# Patient Record
Sex: Female | Born: 1955
Health system: Southern US, Community
[De-identification: ages and names within clinical notes are randomized; demographics above are authoritative.]

## PROBLEM LIST (undated history)

## (undated) DIAGNOSIS — E039 Hypothyroidism, unspecified: Secondary | ICD-10-CM

## (undated) HISTORY — PX: GASTRIC BYPASS: SHX52

## (undated) HISTORY — PX: CARPAL TUNNEL RELEASE: SHX101

---

## 2001-03-08 ENCOUNTER — Other Ambulatory Visit: Admission: RE | Admit: 2001-03-08 | Discharge: 2001-03-08 | Payer: Self-pay | Admitting: Obstetrics and Gynecology

## 2003-03-21 ENCOUNTER — Emergency Department (HOSPITAL_COMMUNITY): Admission: EM | Admit: 2003-03-21 | Discharge: 2003-03-21 | Payer: Self-pay | Admitting: Emergency Medicine

## 2008-05-02 ENCOUNTER — Ambulatory Visit: Payer: Self-pay | Admitting: Emergency Medicine

## 2008-05-02 DIAGNOSIS — J984 Other disorders of lung: Secondary | ICD-10-CM | POA: Insufficient documentation

## 2008-05-02 DIAGNOSIS — E039 Hypothyroidism, unspecified: Secondary | ICD-10-CM | POA: Insufficient documentation

## 2008-05-17 ENCOUNTER — Telehealth: Payer: Self-pay | Admitting: Emergency Medicine

## 2008-05-29 ENCOUNTER — Telehealth (INDEPENDENT_AMBULATORY_CARE_PROVIDER_SITE_OTHER): Payer: Self-pay | Admitting: *Deleted

## 2013-06-07 ENCOUNTER — Other Ambulatory Visit: Payer: Self-pay | Admitting: Radiology

## 2014-08-08 ENCOUNTER — Ambulatory Visit (INDEPENDENT_AMBULATORY_CARE_PROVIDER_SITE_OTHER): Payer: Commercial Managed Care - HMO | Admitting: Surgery

## 2014-09-05 ENCOUNTER — Ambulatory Visit (INDEPENDENT_AMBULATORY_CARE_PROVIDER_SITE_OTHER): Payer: Commercial Managed Care - HMO | Admitting: Surgery

## 2014-09-11 ENCOUNTER — Other Ambulatory Visit (INDEPENDENT_AMBULATORY_CARE_PROVIDER_SITE_OTHER): Payer: Self-pay

## 2014-09-11 ENCOUNTER — Ambulatory Visit (INDEPENDENT_AMBULATORY_CARE_PROVIDER_SITE_OTHER): Payer: Commercial Managed Care - HMO | Admitting: Surgery

## 2014-09-11 DIAGNOSIS — Z9884 Bariatric surgery status: Secondary | ICD-10-CM

## 2014-10-22 ENCOUNTER — Ambulatory Visit
Admission: RE | Admit: 2014-10-22 | Discharge: 2014-10-22 | Disposition: A | Discharge status: Home / Self Care | Payer: Commercial Managed Care - HMO | Source: Ambulatory Visit | Attending: Surgery | Admitting: Surgery

## 2014-10-22 DIAGNOSIS — Z9884 Bariatric surgery status: Secondary | ICD-10-CM

## 2014-12-30 DIAGNOSIS — F321 Major depressive disorder, single episode, moderate: Secondary | ICD-10-CM | POA: Diagnosis not present

## 2015-01-09 DIAGNOSIS — M793 Panniculitis, unspecified: Secondary | ICD-10-CM | POA: Diagnosis not present

## 2015-01-20 DIAGNOSIS — F321 Major depressive disorder, single episode, moderate: Secondary | ICD-10-CM | POA: Diagnosis not present

## 2015-02-06 DIAGNOSIS — F321 Major depressive disorder, single episode, moderate: Secondary | ICD-10-CM | POA: Diagnosis not present

## 2015-02-19 DIAGNOSIS — F321 Major depressive disorder, single episode, moderate: Secondary | ICD-10-CM | POA: Diagnosis not present

## 2015-03-04 DIAGNOSIS — F321 Major depressive disorder, single episode, moderate: Secondary | ICD-10-CM | POA: Diagnosis not present

## 2015-03-17 ENCOUNTER — Other Ambulatory Visit (INDEPENDENT_AMBULATORY_CARE_PROVIDER_SITE_OTHER): Payer: Self-pay

## 2015-03-25 DIAGNOSIS — F321 Major depressive disorder, single episode, moderate: Secondary | ICD-10-CM | POA: Diagnosis not present

## 2015-03-27 DIAGNOSIS — M793 Panniculitis, unspecified: Secondary | ICD-10-CM | POA: Diagnosis not present

## 2015-04-29 DIAGNOSIS — F321 Major depressive disorder, single episode, moderate: Secondary | ICD-10-CM | POA: Diagnosis not present

## 2015-05-20 DIAGNOSIS — F321 Major depressive disorder, single episode, moderate: Secondary | ICD-10-CM | POA: Diagnosis not present

## 2015-05-29 DIAGNOSIS — F418 Other specified anxiety disorders: Secondary | ICD-10-CM | POA: Diagnosis not present

## 2015-05-29 DIAGNOSIS — E039 Hypothyroidism, unspecified: Secondary | ICD-10-CM | POA: Diagnosis not present

## 2015-06-10 DIAGNOSIS — F321 Major depressive disorder, single episode, moderate: Secondary | ICD-10-CM | POA: Diagnosis not present

## 2015-06-23 ENCOUNTER — Other Ambulatory Visit: Payer: Self-pay

## 2015-06-23 DIAGNOSIS — Z01419 Encounter for gynecological examination (general) (routine) without abnormal findings: Secondary | ICD-10-CM | POA: Diagnosis not present

## 2015-06-23 DIAGNOSIS — Z124 Encounter for screening for malignant neoplasm of cervix: Secondary | ICD-10-CM | POA: Diagnosis not present

## 2015-06-24 DIAGNOSIS — F321 Major depressive disorder, single episode, moderate: Secondary | ICD-10-CM | POA: Diagnosis not present

## 2015-06-25 LAB — CYTOLOGY - PAP

## 2016-03-24 ENCOUNTER — Ambulatory Visit (INDEPENDENT_AMBULATORY_CARE_PROVIDER_SITE_OTHER): Payer: Commercial Managed Care - HMO | Admitting: Licensed Clinical Social Worker

## 2016-03-24 DIAGNOSIS — F411 Generalized anxiety disorder: Secondary | ICD-10-CM | POA: Diagnosis not present

## 2016-03-24 DIAGNOSIS — F331 Major depressive disorder, recurrent, moderate: Secondary | ICD-10-CM

## 2016-03-31 ENCOUNTER — Ambulatory Visit (INDEPENDENT_AMBULATORY_CARE_PROVIDER_SITE_OTHER): Payer: Commercial Managed Care - HMO | Admitting: Licensed Clinical Social Worker

## 2016-03-31 DIAGNOSIS — F411 Generalized anxiety disorder: Secondary | ICD-10-CM | POA: Diagnosis not present

## 2016-03-31 DIAGNOSIS — F331 Major depressive disorder, recurrent, moderate: Secondary | ICD-10-CM

## 2016-04-13 ENCOUNTER — Ambulatory Visit (INDEPENDENT_AMBULATORY_CARE_PROVIDER_SITE_OTHER): Payer: Commercial Managed Care - HMO | Admitting: Licensed Clinical Social Worker

## 2016-04-13 DIAGNOSIS — F411 Generalized anxiety disorder: Secondary | ICD-10-CM | POA: Diagnosis not present

## 2016-04-13 DIAGNOSIS — F331 Major depressive disorder, recurrent, moderate: Secondary | ICD-10-CM

## 2016-04-27 ENCOUNTER — Ambulatory Visit: Payer: Self-pay | Admitting: Licensed Clinical Social Worker

## 2016-05-03 ENCOUNTER — Ambulatory Visit (INDEPENDENT_AMBULATORY_CARE_PROVIDER_SITE_OTHER): Payer: Commercial Managed Care - HMO | Admitting: Licensed Clinical Social Worker

## 2016-05-03 DIAGNOSIS — F331 Major depressive disorder, recurrent, moderate: Secondary | ICD-10-CM | POA: Diagnosis not present

## 2016-05-31 DIAGNOSIS — Z0000 Encounter for general adult medical examination without abnormal findings: Secondary | ICD-10-CM | POA: Diagnosis not present

## 2016-05-31 DIAGNOSIS — E038 Other specified hypothyroidism: Secondary | ICD-10-CM | POA: Diagnosis not present

## 2016-06-08 DIAGNOSIS — E039 Hypothyroidism, unspecified: Secondary | ICD-10-CM | POA: Diagnosis not present

## 2016-06-08 DIAGNOSIS — F331 Major depressive disorder, recurrent, moderate: Secondary | ICD-10-CM | POA: Diagnosis not present

## 2016-06-08 DIAGNOSIS — Z1389 Encounter for screening for other disorder: Secondary | ICD-10-CM | POA: Diagnosis not present

## 2016-06-08 DIAGNOSIS — Z0000 Encounter for general adult medical examination without abnormal findings: Secondary | ICD-10-CM | POA: Diagnosis not present

## 2016-06-08 DIAGNOSIS — Z6828 Body mass index (BMI) 28.0-28.9, adult: Secondary | ICD-10-CM | POA: Diagnosis not present

## 2016-06-08 DIAGNOSIS — J019 Acute sinusitis, unspecified: Secondary | ICD-10-CM | POA: Diagnosis not present

## 2016-09-08 DIAGNOSIS — H5210 Myopia, unspecified eye: Secondary | ICD-10-CM | POA: Diagnosis not present

## 2016-09-08 DIAGNOSIS — H2510 Age-related nuclear cataract, unspecified eye: Secondary | ICD-10-CM | POA: Diagnosis not present

## 2016-09-08 DIAGNOSIS — H5200 Hypermetropia, unspecified eye: Secondary | ICD-10-CM | POA: Diagnosis not present

## 2016-10-06 DIAGNOSIS — E038 Other specified hypothyroidism: Secondary | ICD-10-CM | POA: Diagnosis not present

## 2017-06-10 DIAGNOSIS — E039 Hypothyroidism, unspecified: Secondary | ICD-10-CM | POA: Diagnosis not present

## 2017-06-10 DIAGNOSIS — Z0000 Encounter for general adult medical examination without abnormal findings: Secondary | ICD-10-CM | POA: Diagnosis not present

## 2017-06-13 ENCOUNTER — Other Ambulatory Visit: Payer: Self-pay | Admitting: Internal Medicine

## 2017-06-13 DIAGNOSIS — E039 Hypothyroidism, unspecified: Secondary | ICD-10-CM

## 2017-06-13 DIAGNOSIS — E663 Overweight: Secondary | ICD-10-CM | POA: Diagnosis not present

## 2017-06-13 DIAGNOSIS — H9201 Otalgia, right ear: Secondary | ICD-10-CM | POA: Diagnosis not present

## 2017-06-13 DIAGNOSIS — Z0000 Encounter for general adult medical examination without abnormal findings: Secondary | ICD-10-CM | POA: Diagnosis not present

## 2017-06-13 DIAGNOSIS — Z6829 Body mass index (BMI) 29.0-29.9, adult: Secondary | ICD-10-CM | POA: Diagnosis not present

## 2017-06-13 DIAGNOSIS — E049 Nontoxic goiter, unspecified: Secondary | ICD-10-CM

## 2017-06-13 DIAGNOSIS — F331 Major depressive disorder, recurrent, moderate: Secondary | ICD-10-CM | POA: Diagnosis not present

## 2017-06-13 DIAGNOSIS — Z1389 Encounter for screening for other disorder: Secondary | ICD-10-CM | POA: Diagnosis not present

## 2017-06-22 ENCOUNTER — Other Ambulatory Visit: Payer: Self-pay

## 2017-06-27 ENCOUNTER — Ambulatory Visit
Admission: RE | Admit: 2017-06-27 | Discharge: 2017-06-27 | Disposition: A | Discharge status: Home / Self Care | Payer: Medicare HMO | Source: Ambulatory Visit | Attending: Internal Medicine | Admitting: Internal Medicine

## 2017-06-27 DIAGNOSIS — E041 Nontoxic single thyroid nodule: Secondary | ICD-10-CM | POA: Diagnosis not present

## 2017-06-27 DIAGNOSIS — E049 Nontoxic goiter, unspecified: Secondary | ICD-10-CM

## 2017-06-27 DIAGNOSIS — E039 Hypothyroidism, unspecified: Secondary | ICD-10-CM

## 2017-07-07 ENCOUNTER — Other Ambulatory Visit: Payer: Self-pay | Admitting: Internal Medicine

## 2017-07-07 DIAGNOSIS — E041 Nontoxic single thyroid nodule: Secondary | ICD-10-CM

## 2017-07-14 ENCOUNTER — Ambulatory Visit
Admission: RE | Admit: 2017-07-14 | Discharge: 2017-07-14 | Disposition: A | Discharge status: Home / Self Care | Payer: Medicare HMO | Source: Ambulatory Visit | Attending: Internal Medicine | Admitting: Internal Medicine

## 2017-07-14 ENCOUNTER — Other Ambulatory Visit: Payer: Self-pay | Admitting: Internal Medicine

## 2017-07-14 DIAGNOSIS — E041 Nontoxic single thyroid nodule: Secondary | ICD-10-CM

## 2017-08-05 DIAGNOSIS — H5203 Hypermetropia, bilateral: Secondary | ICD-10-CM | POA: Diagnosis not present

## 2017-10-24 ENCOUNTER — Emergency Department (HOSPITAL_COMMUNITY): Payer: Medicare HMO

## 2017-10-24 ENCOUNTER — Emergency Department (HOSPITAL_COMMUNITY)
Admission: EM | Admit: 2017-10-24 | Discharge: 2017-10-24 | Disposition: A | Discharge status: Home / Self Care | Payer: Medicare HMO | Attending: Emergency Medicine | Admitting: Emergency Medicine

## 2017-10-24 DIAGNOSIS — T148XXA Other injury of unspecified body region, initial encounter: Secondary | ICD-10-CM | POA: Diagnosis not present

## 2017-10-24 DIAGNOSIS — M546 Pain in thoracic spine: Secondary | ICD-10-CM | POA: Insufficient documentation

## 2017-10-24 DIAGNOSIS — R0789 Other chest pain: Secondary | ICD-10-CM | POA: Insufficient documentation

## 2017-10-24 DIAGNOSIS — M898X1 Other specified disorders of bone, shoulder: Secondary | ICD-10-CM | POA: Insufficient documentation

## 2017-10-24 DIAGNOSIS — E039 Hypothyroidism, unspecified: Secondary | ICD-10-CM | POA: Insufficient documentation

## 2017-10-24 DIAGNOSIS — M5489 Other dorsalgia: Secondary | ICD-10-CM | POA: Diagnosis not present

## 2017-10-24 DIAGNOSIS — Y92410 Unspecified street and highway as the place of occurrence of the external cause: Secondary | ICD-10-CM | POA: Diagnosis not present

## 2017-10-24 DIAGNOSIS — M549 Dorsalgia, unspecified: Secondary | ICD-10-CM | POA: Diagnosis present

## 2017-10-24 DIAGNOSIS — Y999 Unspecified external cause status: Secondary | ICD-10-CM | POA: Diagnosis not present

## 2017-10-24 DIAGNOSIS — Y9389 Activity, other specified: Secondary | ICD-10-CM | POA: Insufficient documentation

## 2017-10-24 DIAGNOSIS — M25512 Pain in left shoulder: Secondary | ICD-10-CM | POA: Diagnosis not present

## 2017-10-24 MED ORDER — CYCLOBENZAPRINE HCL 5 MG PO TABS: 5.0000 mg | ORAL_TABLET | Freq: Two times a day (BID) | ORAL | 0 refills | Status: AC | PRN

## 2017-10-24 MED ORDER — IBUPROFEN 800 MG PO TABS: 800.0000 mg | ORAL_TABLET | Freq: Three times a day (TID) | ORAL | 0 refills | Status: AC

## 2017-10-24 NOTE — ED Notes (Signed)
C-spine cleared by Vrinda NP.  

## 2017-10-24 NOTE — ED Notes (Signed)
Pt ambulatory and independent at discharge.  Verbalized understanding of discharge instructions 
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
## 2018-06-07 DIAGNOSIS — Z79899 Other long term (current) drug therapy: Secondary | ICD-10-CM | POA: Diagnosis not present

## 2018-06-07 DIAGNOSIS — E038 Other specified hypothyroidism: Secondary | ICD-10-CM | POA: Diagnosis not present

## 2018-06-07 DIAGNOSIS — R82998 Other abnormal findings in urine: Secondary | ICD-10-CM | POA: Diagnosis not present

## 2018-06-13 DIAGNOSIS — Z0000 Encounter for general adult medical examination without abnormal findings: Secondary | ICD-10-CM | POA: Diagnosis not present

## 2018-06-13 DIAGNOSIS — F331 Major depressive disorder, recurrent, moderate: Secondary | ICD-10-CM | POA: Diagnosis not present

## 2018-06-13 DIAGNOSIS — E038 Other specified hypothyroidism: Secondary | ICD-10-CM | POA: Diagnosis not present

## 2018-06-13 DIAGNOSIS — Z6828 Body mass index (BMI) 28.0-28.9, adult: Secondary | ICD-10-CM | POA: Diagnosis not present

## 2018-06-13 DIAGNOSIS — F3340 Major depressive disorder, recurrent, in remission, unspecified: Secondary | ICD-10-CM | POA: Diagnosis not present

## 2018-06-13 DIAGNOSIS — R194 Change in bowel habit: Secondary | ICD-10-CM | POA: Diagnosis not present

## 2018-06-13 DIAGNOSIS — D649 Anemia, unspecified: Secondary | ICD-10-CM | POA: Diagnosis not present

## 2018-06-13 DIAGNOSIS — Z1389 Encounter for screening for other disorder: Secondary | ICD-10-CM | POA: Diagnosis not present

## 2018-06-19 ENCOUNTER — Other Ambulatory Visit (HOSPITAL_COMMUNITY): Payer: Self-pay

## 2018-06-20 ENCOUNTER — Ambulatory Visit (HOSPITAL_COMMUNITY)
Admission: RE | Admit: 2018-06-20 | Discharge: 2018-06-20 | Disposition: A | Discharge status: Home / Self Care | Payer: BLUE CROSS/BLUE SHIELD | Source: Ambulatory Visit | Attending: Internal Medicine | Admitting: Internal Medicine

## 2018-06-20 DIAGNOSIS — D649 Anemia, unspecified: Secondary | ICD-10-CM | POA: Insufficient documentation

## 2018-06-20 MED ORDER — SODIUM CHLORIDE 0.9 % IV SOLN
510.0000 mg | INTRAVENOUS | Status: DC
  Administered 2018-06-20: 510 mg via INTRAVENOUS
  Filled 2018-06-20: qty 17

## 2018-06-20 NOTE — Discharge Instructions (Signed)

## 2018-06-26 ENCOUNTER — Ambulatory Visit (HOSPITAL_COMMUNITY)
Admission: RE | Admit: 2018-06-26 | Discharge: 2018-06-26 | Disposition: A | Discharge status: Home / Self Care | Payer: BLUE CROSS/BLUE SHIELD | Source: Ambulatory Visit | Attending: Internal Medicine | Admitting: Internal Medicine

## 2018-06-26 DIAGNOSIS — D649 Anemia, unspecified: Secondary | ICD-10-CM | POA: Diagnosis not present

## 2018-06-26 MED ORDER — SODIUM CHLORIDE 0.9 % IV SOLN
510.0000 mg | INTRAVENOUS | Status: DC
  Filled 2018-06-26: qty 17

## 2018-06-26 MED ORDER — SODIUM CHLORIDE 0.9 % IV SOLN
510.0000 mg | INTRAVENOUS | Status: AC
  Administered 2018-06-26: 510 mg via INTRAVENOUS
  Filled 2018-06-26: qty 17

## 2018-06-27 ENCOUNTER — Encounter (HOSPITAL_COMMUNITY): Payer: Self-pay

## 2019-03-14 IMAGING — US US SOFT TISSUE HEAD/NECK
1 series · 13 of 25 positions shown · non-contrast
Comparison: 06/27/2017

CLINICAL DATA: Incidental on other study.

EXAM:
THYROID ULTRASOUND
TECHNIQUE: Ultrasound examination of the thyroid gland and adjacent soft
tissues was performed.

[Series 1: us soft tissue head/neck · 0.04mm/px · 13 of 25 slices shown]
[im 1/25]
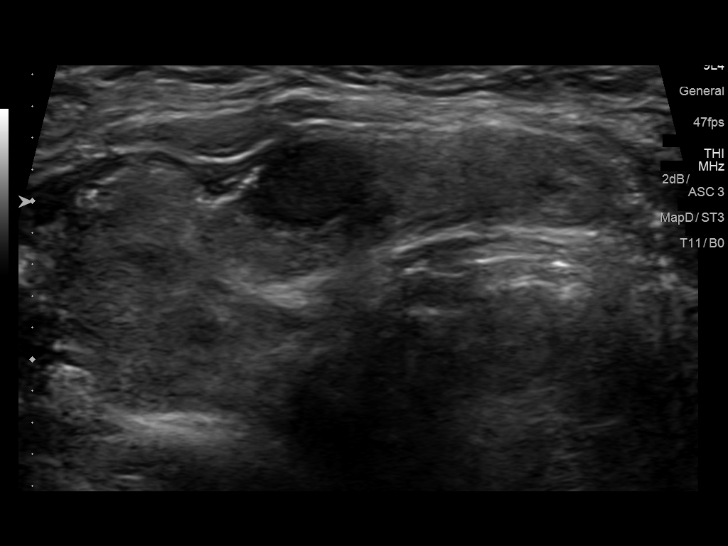
[im 3/25]
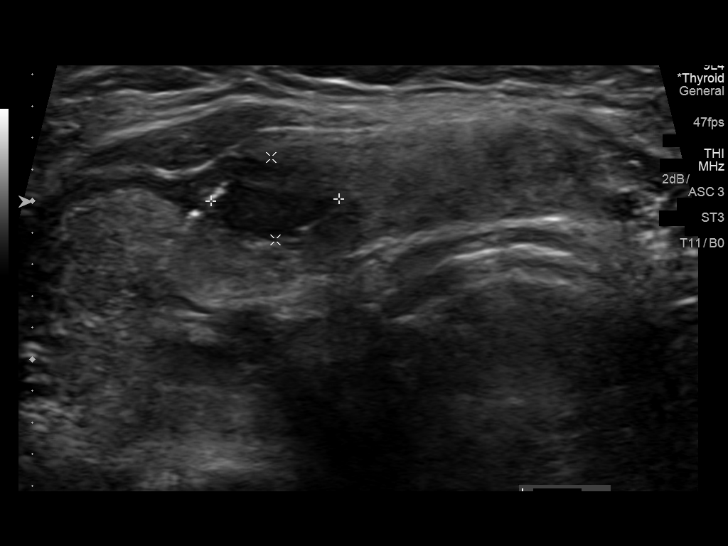
[im 5/25]
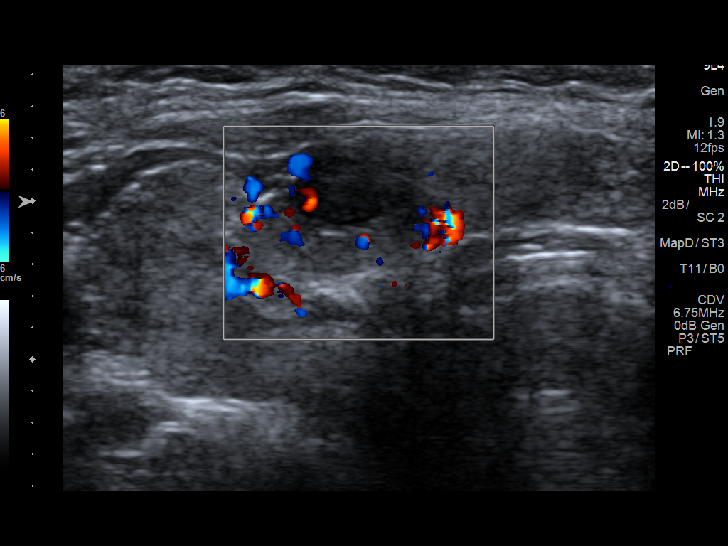
[im 7/25]
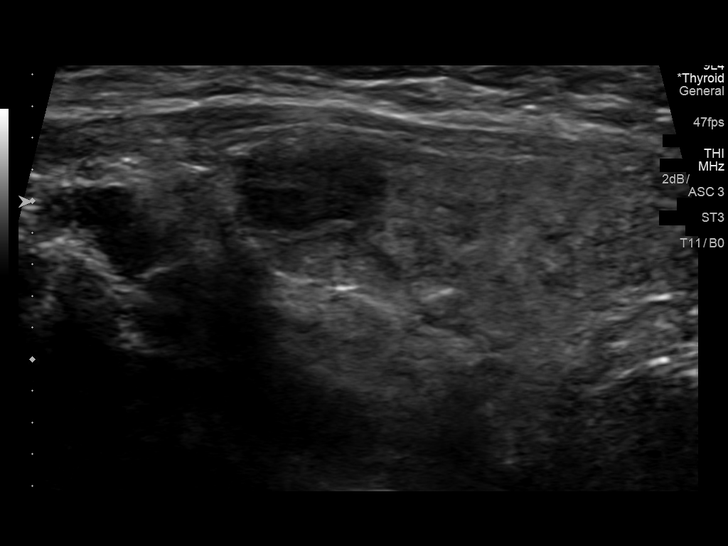
[im 9/25]
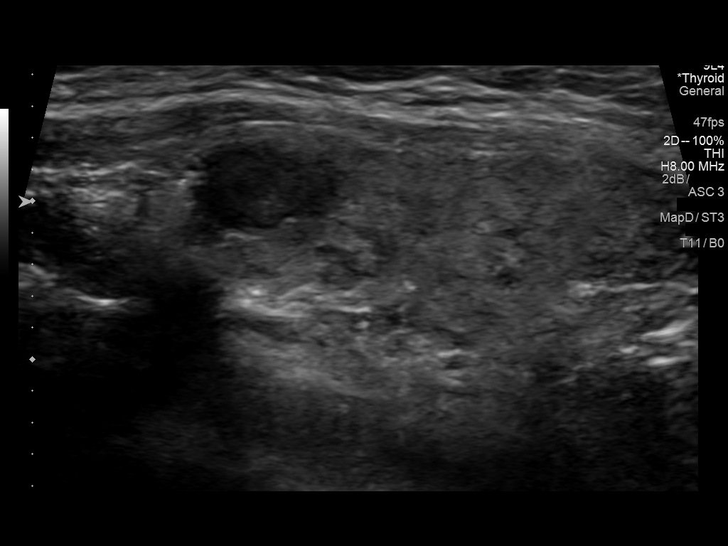
[im 11/25]
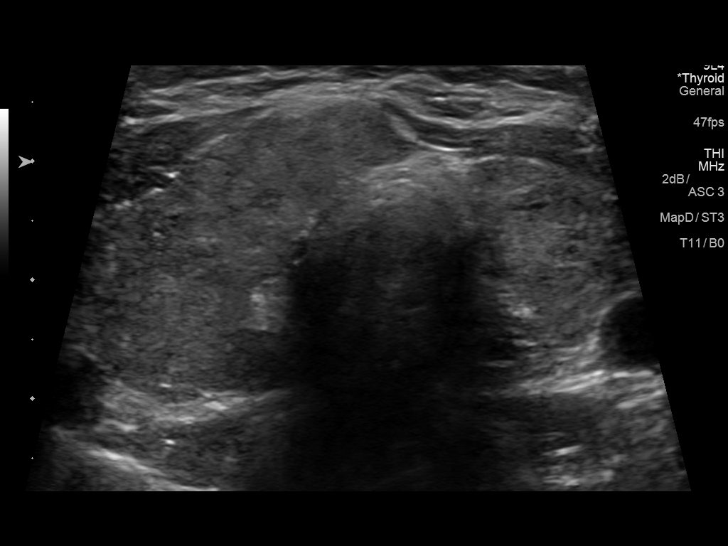
[im 13/25]
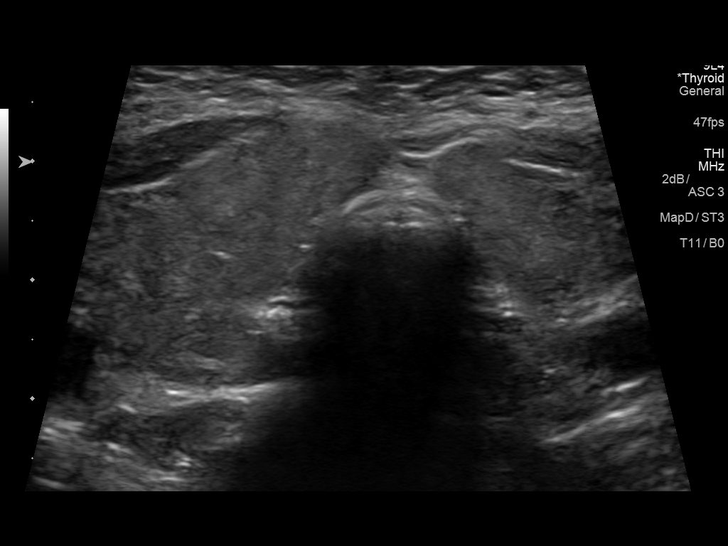
[im 15/25]
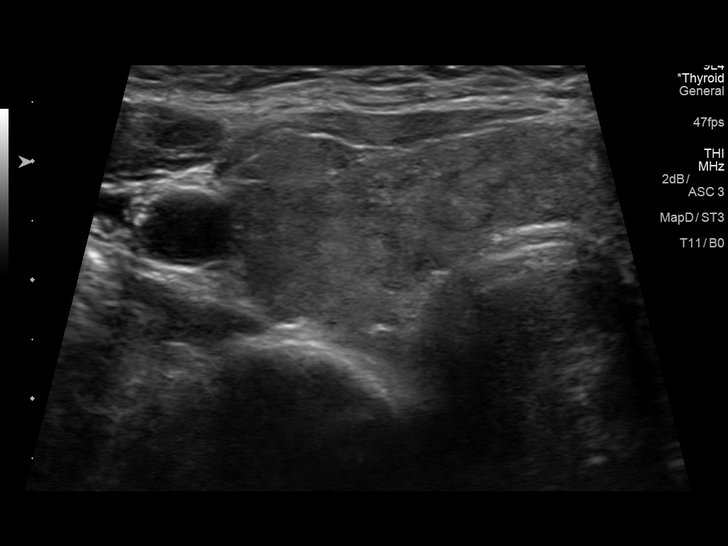
[im 17/25]
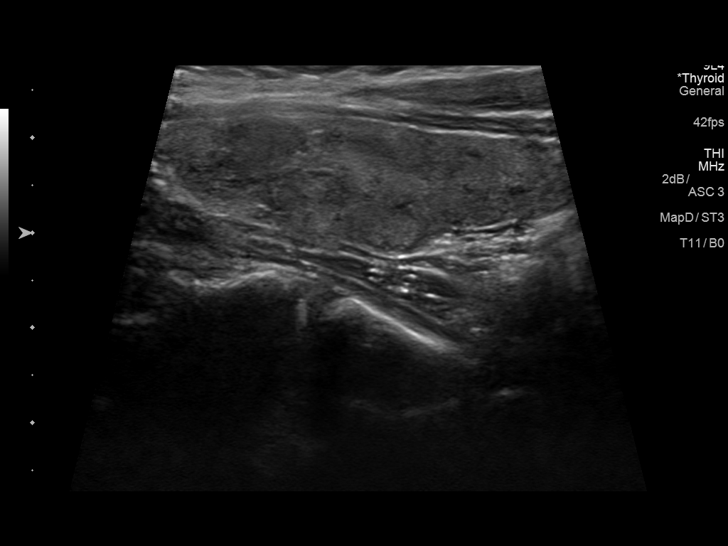
[im 19/25]
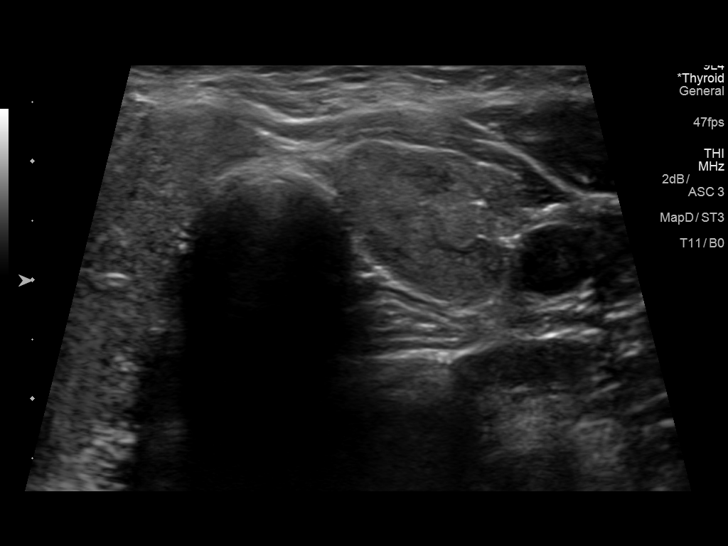
[im 21/25]
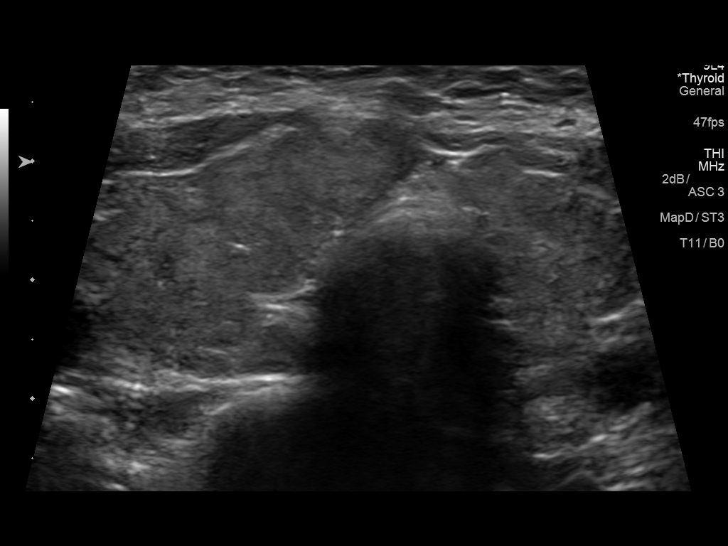
[im 23/25]
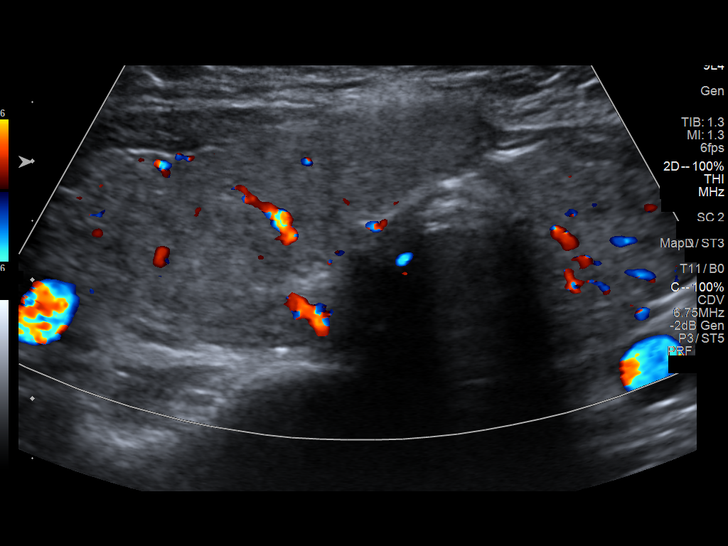
[im 25/25]
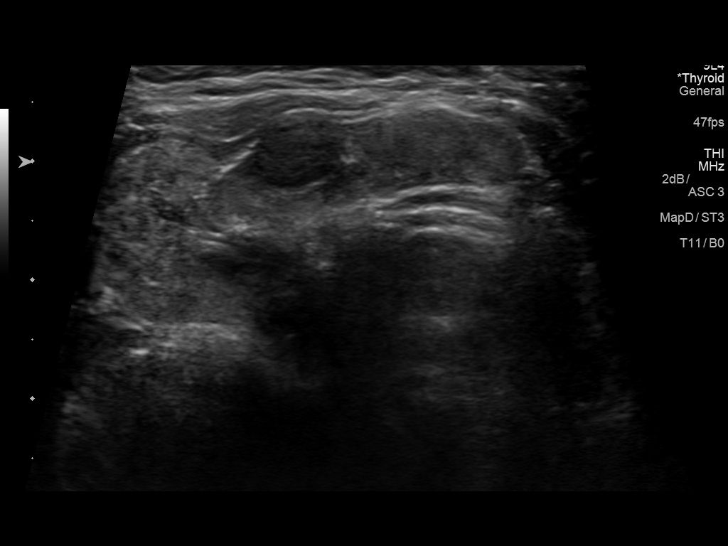

[13 of 25 positions shown; findings below may reference images not displayed]

FINDINGS: Parenchymal Echotexture: Moderately heterogenous

Isthmus: 4 mm

Right lobe: 4.0 x 1.6 x 2.4 cm

Left lobe: 4.5 x 1.4 x 1.5 cm

_________________________________________________________

Estimated total number of nodules >/= 1 cm: 1

Number of spongiform nodules >/=  2 cm not described below (TR1): 0

Number of mixed cystic and solid nodules >/= 1.5 cm not described
below (TR2): 0

_________________________________________________________

Nodule # 1:

Location: Isthmus; Mid

Maximum size: 1.0 cm; Other 2 dimensions: 0.5 x 0.8 cm

Composition: solid/almost completely solid (2)

Echogenicity: hypoechoic (2)

Shape: not taller-than-wide (0)

Margins: ill-defined (0)

Echogenic foci: none (0)

ACR TI-RADS total points: 4.

ACR TI-RADS risk category: TR4 (4-6 points).

ACR TI-RADS recommendations:

*Given size (>/= 1 - 1.4 cm) and appearance, a follow-up ultrasound
in 1 year should be considered based on TI-RADS criteria.

_________________________________________________________

The previously described 3.2 cm mid isthmus nodule was re-evaluated
today. This represents a pseudo nodule. In this region there is
ill-defined gland prominence without a discrete abnormality.
Therefore biopsy not performed.
IMPRESSION: 1 cm right mid isthmus TR 4 nodule meets criteria for follow-up in 1
year.

Previously described 3.2 cm mid isthmus nodule is compatible with a
pseudo nodule. See above comment.

The above is in keeping with the ACR TI-RADS recommendations - [HOSPITAL] 8973;[DATE].

## 2019-06-11 DIAGNOSIS — Z0000 Encounter for general adult medical examination without abnormal findings: Secondary | ICD-10-CM | POA: Diagnosis not present

## 2019-06-11 DIAGNOSIS — E038 Other specified hypothyroidism: Secondary | ICD-10-CM | POA: Diagnosis not present

## 2019-06-18 DIAGNOSIS — D649 Anemia, unspecified: Secondary | ICD-10-CM | POA: Diagnosis not present

## 2019-06-18 DIAGNOSIS — F3340 Major depressive disorder, recurrent, in remission, unspecified: Secondary | ICD-10-CM | POA: Diagnosis not present

## 2019-06-18 DIAGNOSIS — F418 Other specified anxiety disorders: Secondary | ICD-10-CM | POA: Diagnosis not present

## 2019-06-18 DIAGNOSIS — Z1331 Encounter for screening for depression: Secondary | ICD-10-CM | POA: Diagnosis not present

## 2019-06-18 DIAGNOSIS — E041 Nontoxic single thyroid nodule: Secondary | ICD-10-CM | POA: Diagnosis not present

## 2019-06-18 DIAGNOSIS — Z0000 Encounter for general adult medical examination without abnormal findings: Secondary | ICD-10-CM | POA: Diagnosis not present

## 2019-06-20 ENCOUNTER — Other Ambulatory Visit: Payer: Self-pay | Admitting: Internal Medicine

## 2019-06-20 DIAGNOSIS — E041 Nontoxic single thyroid nodule: Secondary | ICD-10-CM

## 2019-07-05 DIAGNOSIS — Z1211 Encounter for screening for malignant neoplasm of colon: Secondary | ICD-10-CM | POA: Diagnosis not present

## 2019-07-05 DIAGNOSIS — K5730 Diverticulosis of large intestine without perforation or abscess without bleeding: Secondary | ICD-10-CM | POA: Diagnosis not present

## 2019-07-05 DIAGNOSIS — K219 Gastro-esophageal reflux disease without esophagitis: Secondary | ICD-10-CM | POA: Diagnosis not present

## 2019-08-17 DIAGNOSIS — K5730 Diverticulosis of large intestine without perforation or abscess without bleeding: Secondary | ICD-10-CM | POA: Diagnosis not present

## 2019-08-17 DIAGNOSIS — Z1211 Encounter for screening for malignant neoplasm of colon: Secondary | ICD-10-CM | POA: Diagnosis not present

## 2019-11-01 ENCOUNTER — Other Ambulatory Visit: Payer: Self-pay

## 2019-11-01 DIAGNOSIS — Z20822 Contact with and (suspected) exposure to covid-19: Secondary | ICD-10-CM

## 2019-11-03 LAB — NOVEL CORONAVIRUS, NAA: SARS-CoV-2, NAA: NOT DETECTED

## 2019-12-11 DIAGNOSIS — E038 Other specified hypothyroidism: Secondary | ICD-10-CM | POA: Diagnosis not present

## 2019-12-11 DIAGNOSIS — E611 Iron deficiency: Secondary | ICD-10-CM | POA: Diagnosis not present

## 2019-12-11 DIAGNOSIS — D649 Anemia, unspecified: Secondary | ICD-10-CM | POA: Diagnosis not present

## 2019-12-11 DIAGNOSIS — E041 Nontoxic single thyroid nodule: Secondary | ICD-10-CM | POA: Diagnosis not present

## 2019-12-12 DIAGNOSIS — E875 Hyperkalemia: Secondary | ICD-10-CM | POA: Diagnosis not present

## 2019-12-12 DIAGNOSIS — E039 Hypothyroidism, unspecified: Secondary | ICD-10-CM | POA: Diagnosis not present

## 2019-12-12 DIAGNOSIS — E611 Iron deficiency: Secondary | ICD-10-CM | POA: Diagnosis not present

## 2019-12-12 DIAGNOSIS — R5383 Other fatigue: Secondary | ICD-10-CM | POA: Diagnosis not present

## 2020-06-26 DIAGNOSIS — Z0000 Encounter for general adult medical examination without abnormal findings: Secondary | ICD-10-CM | POA: Diagnosis not present

## 2020-06-26 DIAGNOSIS — D649 Anemia, unspecified: Secondary | ICD-10-CM | POA: Diagnosis not present

## 2020-06-26 DIAGNOSIS — E039 Hypothyroidism, unspecified: Secondary | ICD-10-CM | POA: Diagnosis not present

## 2020-06-26 DIAGNOSIS — E041 Nontoxic single thyroid nodule: Secondary | ICD-10-CM | POA: Diagnosis not present

## 2020-06-26 DIAGNOSIS — E611 Iron deficiency: Secondary | ICD-10-CM | POA: Diagnosis not present

## 2020-07-03 DIAGNOSIS — Z1331 Encounter for screening for depression: Secondary | ICD-10-CM | POA: Diagnosis not present

## 2020-07-03 DIAGNOSIS — E611 Iron deficiency: Secondary | ICD-10-CM | POA: Diagnosis not present

## 2020-07-03 DIAGNOSIS — Z0000 Encounter for general adult medical examination without abnormal findings: Secondary | ICD-10-CM | POA: Diagnosis not present

## 2020-07-03 DIAGNOSIS — D649 Anemia, unspecified: Secondary | ICD-10-CM | POA: Diagnosis not present

## 2020-07-03 DIAGNOSIS — R82998 Other abnormal findings in urine: Secondary | ICD-10-CM | POA: Diagnosis not present

## 2020-07-03 DIAGNOSIS — E039 Hypothyroidism, unspecified: Secondary | ICD-10-CM | POA: Diagnosis not present

## 2020-07-03 DIAGNOSIS — F418 Other specified anxiety disorders: Secondary | ICD-10-CM | POA: Diagnosis not present

## 2020-07-03 DIAGNOSIS — Z9884 Bariatric surgery status: Secondary | ICD-10-CM | POA: Diagnosis not present

## 2020-07-04 ENCOUNTER — Other Ambulatory Visit: Payer: Self-pay | Admitting: Internal Medicine

## 2020-07-04 DIAGNOSIS — E041 Nontoxic single thyroid nodule: Secondary | ICD-10-CM

## 2020-07-17 ENCOUNTER — Ambulatory Visit
Admission: RE | Admit: 2020-07-17 | Discharge: 2020-07-17 | Disposition: A | Discharge status: Home / Self Care | Payer: BC Managed Care – PPO | Source: Ambulatory Visit | Attending: Internal Medicine | Admitting: Internal Medicine

## 2020-07-17 ENCOUNTER — Other Ambulatory Visit: Payer: Self-pay

## 2020-07-17 DIAGNOSIS — E041 Nontoxic single thyroid nodule: Secondary | ICD-10-CM | POA: Diagnosis not present

## 2021-05-22 ENCOUNTER — Emergency Department (HOSPITAL_BASED_OUTPATIENT_CLINIC_OR_DEPARTMENT_OTHER)
Admission: EM | Admit: 2021-05-22 | Discharge: 2021-05-23 | Disposition: A | Discharge status: Home / Self Care | Payer: BC Managed Care – PPO | Attending: Emergency Medicine | Admitting: Emergency Medicine

## 2021-05-22 ENCOUNTER — Emergency Department (HOSPITAL_BASED_OUTPATIENT_CLINIC_OR_DEPARTMENT_OTHER): Payer: BC Managed Care – PPO

## 2021-05-22 ENCOUNTER — Encounter (HOSPITAL_BASED_OUTPATIENT_CLINIC_OR_DEPARTMENT_OTHER): Payer: Self-pay

## 2021-05-22 ENCOUNTER — Other Ambulatory Visit: Payer: Self-pay

## 2021-05-22 DIAGNOSIS — R079 Chest pain, unspecified: Secondary | ICD-10-CM

## 2021-05-22 DIAGNOSIS — R072 Precordial pain: Secondary | ICD-10-CM | POA: Diagnosis not present

## 2021-05-22 DIAGNOSIS — E039 Hypothyroidism, unspecified: Secondary | ICD-10-CM | POA: Diagnosis not present

## 2021-05-22 DIAGNOSIS — Z79899 Other long term (current) drug therapy: Secondary | ICD-10-CM | POA: Insufficient documentation

## 2021-05-22 DIAGNOSIS — R0789 Other chest pain: Secondary | ICD-10-CM | POA: Diagnosis not present

## 2021-05-22 HISTORY — DX: Hypothyroidism, unspecified: E03.9

## 2021-05-22 LAB — CBC WITH DIFFERENTIAL/PLATELET
Abs Immature Granulocytes: 0.01 10*3/uL (ref 0.00–0.07)
Basophils Absolute: 0 10*3/uL (ref 0.0–0.1)
Basophils Relative: 1 %
Eosinophils Absolute: 0.2 10*3/uL (ref 0.0–0.5)
Eosinophils Relative: 2 %
HCT: 41.4 % (ref 36.0–46.0)
Hemoglobin: 13.3 g/dL (ref 12.0–15.0)
Immature Granulocytes: 0 %
Lymphocytes Relative: 42 %
Lymphs Abs: 3.7 10*3/uL (ref 0.7–4.0)
MCH: 29.7 pg (ref 26.0–34.0)
MCHC: 32.1 g/dL (ref 30.0–36.0)
MCV: 92.4 fL (ref 80.0–100.0)
Monocytes Absolute: 0.9 10*3/uL (ref 0.1–1.0)
Monocytes Relative: 10 %
Neutro Abs: 4 10*3/uL (ref 1.7–7.7)
Neutrophils Relative %: 45 %
Platelets: 330 10*3/uL (ref 150–400)
RBC: 4.48 MIL/uL (ref 3.87–5.11)
RDW: 14.4 % (ref 11.5–15.5)
WBC: 8.7 10*3/uL (ref 4.0–10.5)
nRBC: 0 % (ref 0.0–0.2)

## 2021-05-22 LAB — COMPREHENSIVE METABOLIC PANEL
ALT: 16 U/L (ref 0–44)
AST: 21 U/L (ref 15–41)
Albumin: 4.4 g/dL (ref 3.5–5.0)
Alkaline Phosphatase: 110 U/L (ref 38–126)
Anion gap: 9 (ref 5–15)
BUN: 16 mg/dL (ref 8–23)
CO2: 26 mmol/L (ref 22–32)
Calcium: 9.7 mg/dL (ref 8.9–10.3)
Chloride: 101 mmol/L (ref 98–111)
Creatinine, Ser: 0.74 mg/dL (ref 0.44–1.00)
GFR, Estimated: >60 mL/min (ref >60)
Glucose, Bld: 74 mg/dL (ref 70–99)
Potassium: 3.7 mmol/L (ref 3.5–5.1)
Sodium: 136 mmol/L (ref 135–145)
Total Bilirubin: 0.4 mg/dL (ref 0.3–1.2)
Total Protein: 8.2 g/dL — ABNORMAL HIGH (ref 6.5–8.1)

## 2021-05-22 LAB — TROPONIN I (HIGH SENSITIVITY)
Troponin I (High Sensitivity): 2 ng/L (ref <18)
Troponin I (High Sensitivity): 3 ng/L (ref <18)

## 2021-05-22 NOTE — ED Triage Notes (Signed)
Pt to ED from home with c/o intermittent mid sternal chest pain x2 years. Pt states she was in bed last night and was awoken by a sharp pain in the center of her chest that lasted approx. 10 mins and then went away. Pt states this happens from time to time but her PCP wanted her to be evaluated.

## 2021-05-23 ENCOUNTER — Encounter (HOSPITAL_BASED_OUTPATIENT_CLINIC_OR_DEPARTMENT_OTHER): Payer: Self-pay | Admitting: Emergency Medicine

## 2021-05-23 NOTE — ED Provider Notes (Signed)
[facility] EMERGENCY DEPT Provider Note   CSN: [id number] Arrival date & time: 05/22/21  1910     History Chief Complaint  Patient presents with  . Chest Pain    Terri Moore is a 65 y.o. female.  65 yo F with a chief complaint of chest pain.  This woke her up from sleep last night.  Lasted for few minutes and then resolved.  Described as sternal felt like a pressure.  Denies any radiation.  Denies shortness of breath diaphoresis nausea or vomiting.  Has had this happen to her in the past.  Spoke with her family doctor who suggested she come to the ED for evaluation.  She denies any exertional symptoms.  Denies any ongoing symptoms.  Patient denies history of MI, denies hypertension hyperlipidemia diabetes or smoking.  Brother had an MI in his mid 42s  Patient denies history of PE or DVT denies hemoptysis denies unilateral lower extremity edema denies recent surgery immobilization hospitalization estrogen use or history of cancer.   The history is provided by the patient.  Chest Pain Pain location:  Substernal area Pain quality: aching and pressure   Pain radiates to:  Does not radiate Pain severity:  Severe Onset quality:  Sudden Duration:  2 minutes Timing:  Rare Progression:  Resolved Chronicity:  New Relieved by:  Nothing Ineffective treatments:  None tried Associated symptoms: no dizziness, no fever, no headache, no nausea, no palpitations, no shortness of breath and no vomiting        Past Medical History:  Diagnosis Date  . Hypothyroid     Patient Active Problem List   Diagnosis Date Noted  . HYPOTHYROIDISM 05/02/2008  . PULMONARY NODULE 05/02/2008    Past Surgical History:  Procedure Laterality Date  . CARPAL TUNNEL RELEASE    . CESAREAN SECTION    . GASTRIC BYPASS       OB History   No obstetric history on file.     No family history on file.     Home Medications Prior to Admission medications   Medication Sig Start  Date End Date Taking? Authorizing Provider  levothyroxine (SYNTHROID) 125 MCG tablet Take 125 mcg by mouth daily before breakfast.   Yes [provider]  cyclobenzaprine (FLEXERIL) 5 MG tablet Take 1 tablet (5 mg total) by mouth 2 (two) times daily as needed for muscle spasms. 10/24/17   Teressa Lower, NP  ibuprofen (ADVIL,MOTRIN) 800 MG tablet Take 1 tablet (800 mg total) by mouth 3 (three) times daily. 10/24/17   Teressa Lower, NP    Allergies    Patient has no known allergies.  Review of Systems   Review of Systems  Constitutional: Negative for chills and fever.  HENT: Negative for congestion and rhinorrhea.   Eyes: Negative for redness and visual disturbance.  Respiratory: Negative for shortness of breath and wheezing.   Cardiovascular: Positive for chest pain. Negative for palpitations.  Gastrointestinal: Negative for nausea and vomiting.  Genitourinary: Negative for dysuria and urgency.  Musculoskeletal: Negative for arthralgias and myalgias.  Skin: Negative for pallor and wound.  Neurological: Negative for dizziness and headaches.    Physical Exam Updated Vital Signs BP 139/77   Pulse 61   Temp 98.3 F (36.8 C) (Oral)   Resp 16   Ht 5\' 5"  (1.651 m)   Wt 79.4 kg   SpO2 97%   BMI 29.12 kg/m   Physical Exam Vitals and nursing note reviewed.  Constitutional:  General: She is not in acute distress.    Appearance: She is well-developed. She is not diaphoretic.  HENT:     Head: Normocephalic and atraumatic.  Eyes:     Pupils: Pupils are equal, round, and reactive to light.  Cardiovascular:     Rate and Rhythm: Normal rate and regular rhythm.     Heart sounds: No murmur heard. No friction rub. No gallop.   Pulmonary:     Effort: Pulmonary effort is normal.     Breath sounds: No wheezing or rales.  Chest:     Chest wall: No tenderness.  Abdominal:     General: There is no distension.     Palpations: Abdomen is soft.     Tenderness: There is  no abdominal tenderness.  Musculoskeletal:        General: No tenderness.     Cervical back: Normal range of motion and neck supple.  Skin:    General: Skin is warm and dry.  Neurological:     Mental Status: She is alert and oriented to person, place, and time.  Psychiatric:        Behavior: Behavior normal.     ED Results / Procedures / Treatments   Labs (all labs ordered are listed, but only abnormal results are displayed) Labs Reviewed  COMPREHENSIVE METABOLIC PANEL - Abnormal; Notable for the following components:      Result Value   Total Protein 8.2 (*)    All other components within normal limits  CBC WITH DIFFERENTIAL/PLATELET  TROPONIN I (HIGH SENSITIVITY)  TROPONIN I (HIGH SENSITIVITY)    EKG EKG Interpretation  Date/Time:  Friday May 22 2021 19:34:20 EDT Ventricular Rate:  81 PR Interval:  164 QRS Duration: 90 QT Interval:  376 QTC Calculation: 436 R Axis:   -42 Text Interpretation: Normal sinus rhythm Left axis deviation ST & T wave abnormality, consider lateral ischemia Abnormal ECG No old tracing to compare Confirmed by Melene Plan [phone]) on 05/22/2021 11:17:33 PM   Radiology DG Chest 1 View  Result Date: 05/22/2021 CLINICAL DATA:  Midsternal chest pain for 2 years, along glass night with sharp central chest pain EXAM: CHEST  1 VIEW COMPARISON:  CT 05/02/2008 FINDINGS: No consolidation, features of edema, pneumothorax, or effusion. Pulmonary vascularity is normally distributed. The cardiomediastinal contours are unremarkable. No acute osseous or soft tissue abnormality. Surgical clips noted at the diaphragmatic hiatus and in the gallbladder fossa. IMPRESSION: No acute cardiopulmonary abnormality. Electronically Signed   By: Kreg Shropshire M.D.   On: 05/22/2021 20:30    Procedures Procedures   Medications Ordered in ED Medications - No data to display  ED Course  I have reviewed the triage vital signs and the nursing notes.  Pertinent labs & imaging  results that were available during my care of the patient were reviewed by me and considered in my medical decision making (see chart for details).    MDM Rules/Calculators/A&P                          65 yo F with a chief complaint of chest pain.  This woke her from sleep last night.  Has not had pain since.  Has 2 troponins here that are negative.  EKG without any changes from baseline.  History is atypical for ACS.  We will have her follow-up with her family doctor in the office.  Trial of H2 blockers.  1:02 AM:  I have discussed the  diagnosis/risks/treatment options with the patient and believe the pt to be eligible for discharge home to follow-up with PCP. We also discussed returning to the ED immediately if new or worsening sx occur. We discussed the sx which are most concerning (e.g., sudden worsening pain, fever, inability to tolerate by mouth) that necessitate immediate return. Medications administered to the patient during their visit and any new prescriptions provided to the patient are listed below.  Medications given during this visit Medications - No data to display   The patient appears reasonably screen and/or stabilized for discharge and I doubt any other medical condition or other [facility] requiring further screening, evaluation, or treatment in the ED at this time prior to discharge.   Final Clinical Impression(s) / ED Diagnoses Final diagnoses:  Nonspecific chest pain    Rx / DC Orders ED Discharge Orders    None       Melene Plan, DO 05/23/21 0102
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
## 2021-05-23 NOTE — Discharge Instructions (Signed)
Try pepcid or tagamet up to twice a day.  Try to avoid things that may make this worse, most commonly these are spicy foods tomato based products fatty foods chocolate and peppermint.  Alcohol and tobacco can also make this worse.  Return to the emergency department for sudden worsening pain fever or inability to eat or drink.  

## 2021-07-02 DIAGNOSIS — E611 Iron deficiency: Secondary | ICD-10-CM | POA: Diagnosis not present

## 2021-07-02 DIAGNOSIS — E039 Hypothyroidism, unspecified: Secondary | ICD-10-CM | POA: Diagnosis not present

## 2021-07-02 DIAGNOSIS — Z0000 Encounter for general adult medical examination without abnormal findings: Secondary | ICD-10-CM | POA: Diagnosis not present

## 2021-10-30 DIAGNOSIS — E039 Hypothyroidism, unspecified: Secondary | ICD-10-CM | POA: Diagnosis not present

## 2021-10-30 DIAGNOSIS — D649 Anemia, unspecified: Secondary | ICD-10-CM | POA: Diagnosis not present

## 2021-11-03 DIAGNOSIS — Z0000 Encounter for general adult medical examination without abnormal findings: Secondary | ICD-10-CM | POA: Diagnosis not present

## 2021-11-03 DIAGNOSIS — Z1339 Encounter for screening examination for other mental health and behavioral disorders: Secondary | ICD-10-CM | POA: Diagnosis not present

## 2021-11-03 DIAGNOSIS — Z1331 Encounter for screening for depression: Secondary | ICD-10-CM | POA: Diagnosis not present

## 2021-11-03 DIAGNOSIS — E039 Hypothyroidism, unspecified: Secondary | ICD-10-CM | POA: Diagnosis not present

## 2021-11-12 DIAGNOSIS — F4321 Adjustment disorder with depressed mood: Secondary | ICD-10-CM | POA: Diagnosis not present

## 2022-01-03 IMAGING — US US THYROID
1 series · 14 of 25 positions shown · non-contrast
Comparison: 06/27/2017

CLINICAL DATA: Nodule.

EXAM:
THYROID ULTRASOUND
TECHNIQUE: Ultrasound examination of the thyroid gland and adjacent soft
tissues was performed.

[Series 1: us thyroid · 0.08mm/px · 14 of 42 slices shown]
[im 1/42]
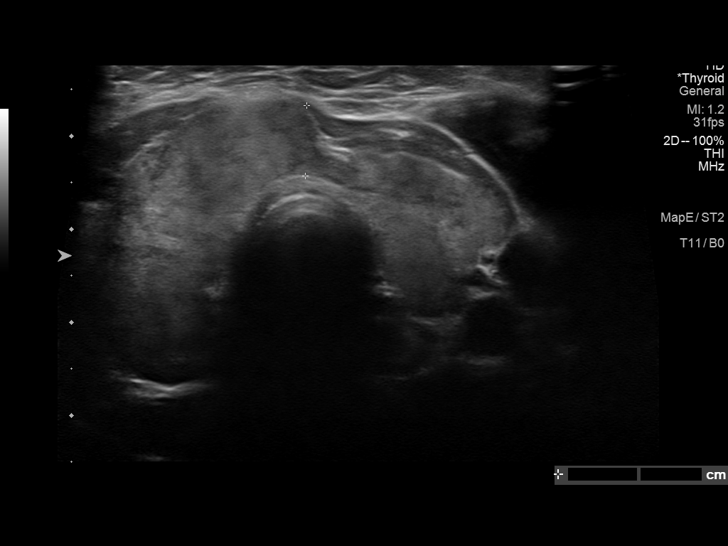
[im 4/42]
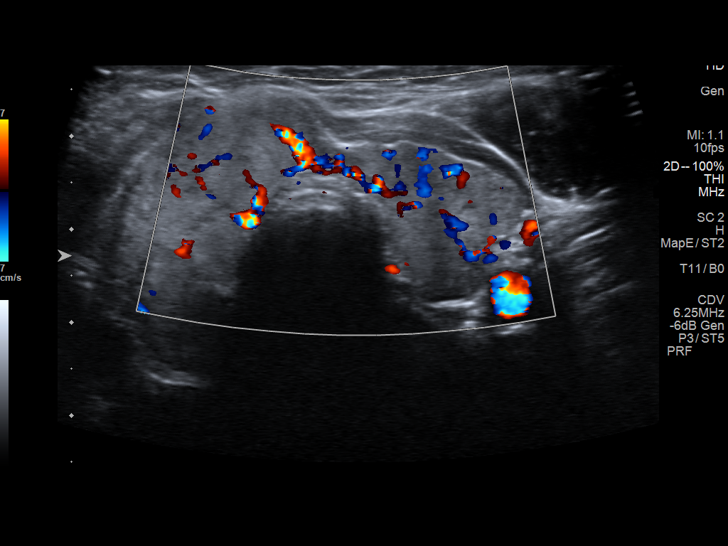
[im 7/42]
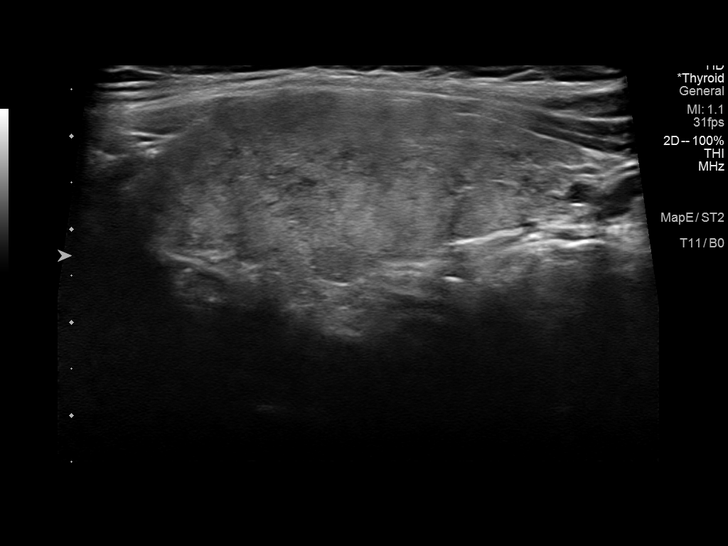
[im 11/42]
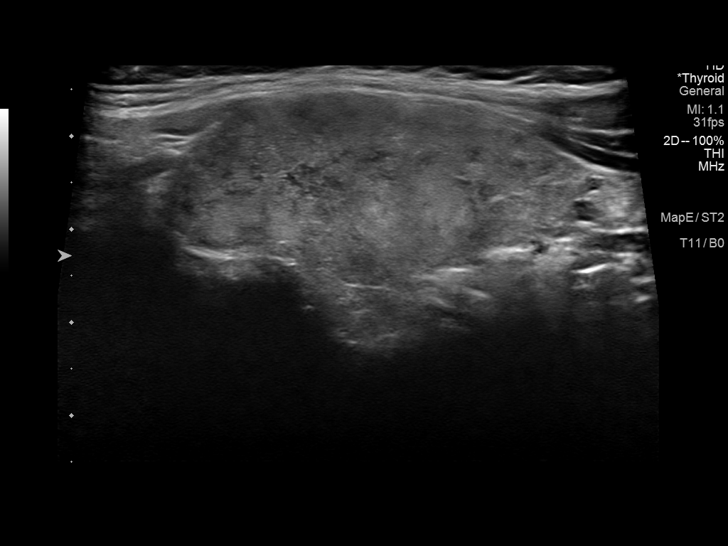
[im 14/42]
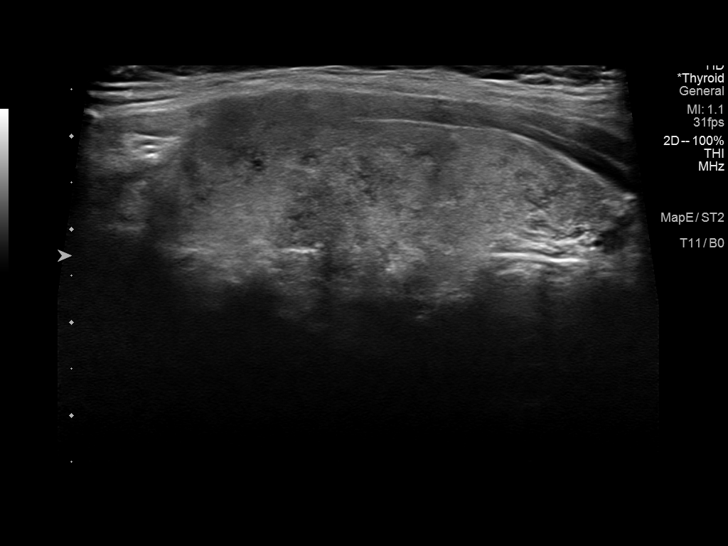
[im 16/42]
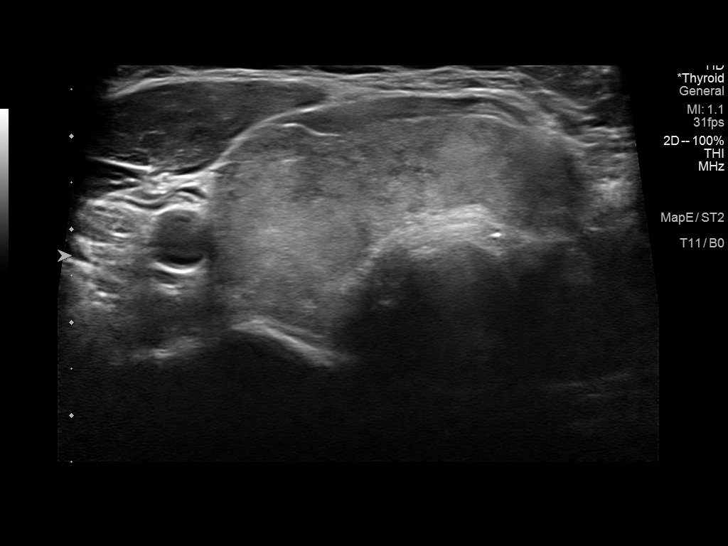
[im 19/42]
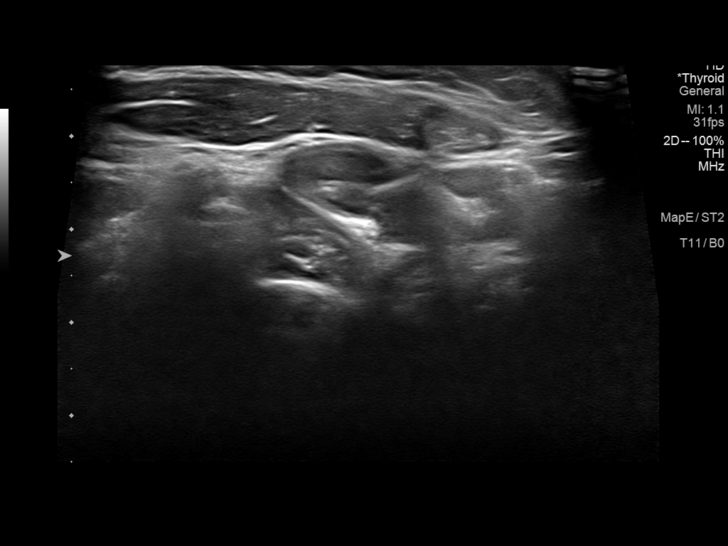
[im 23/42]
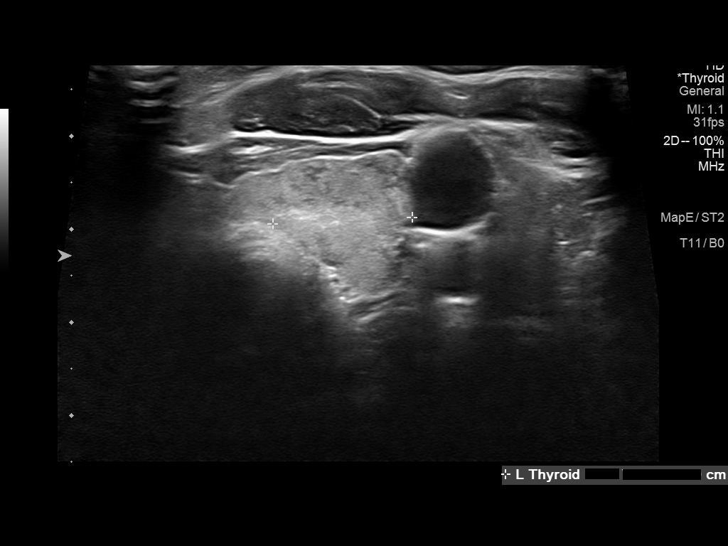
[im 26/42]
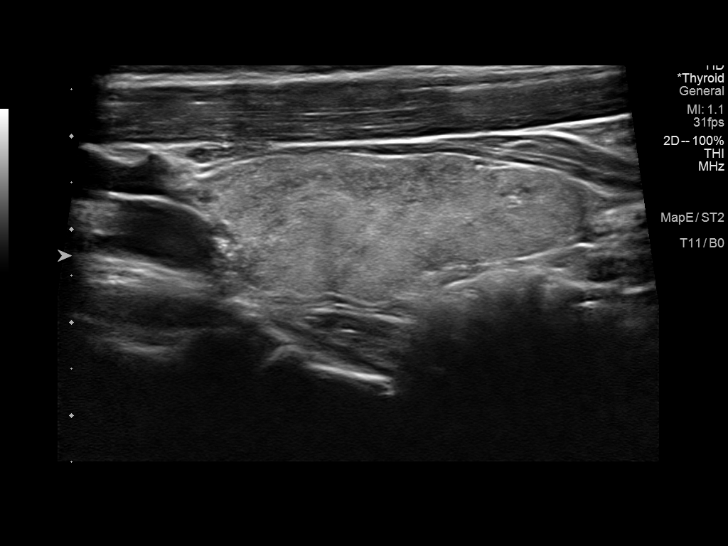
[im 28/42]
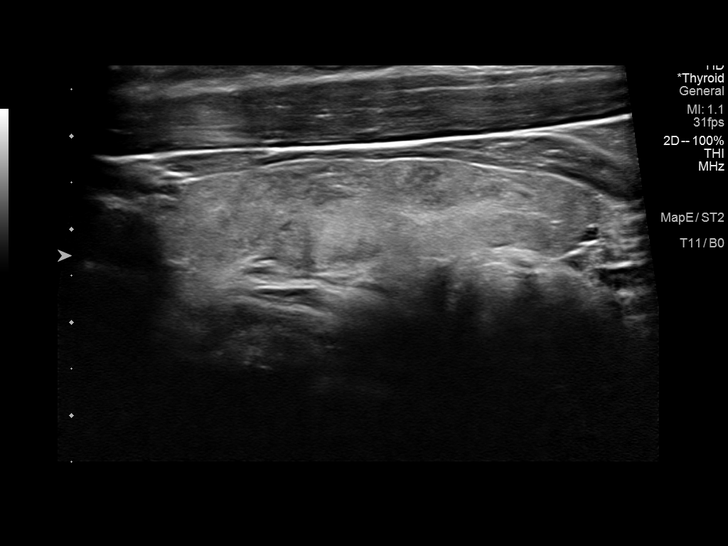
[im 31/42]
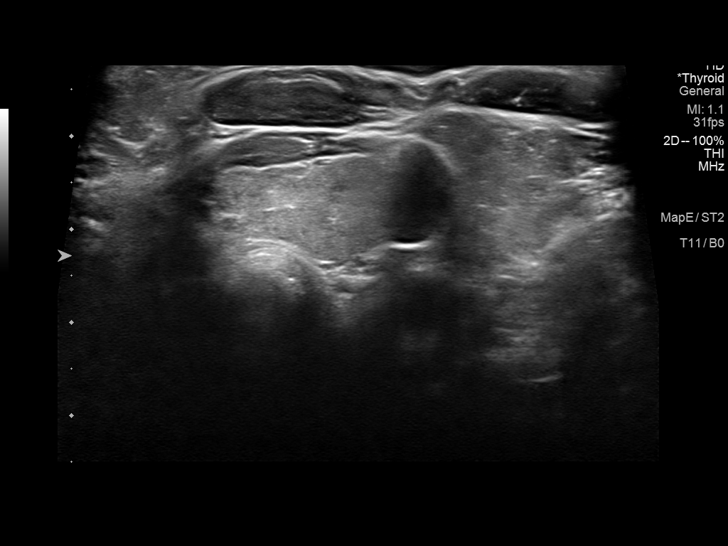
[im 35/42]
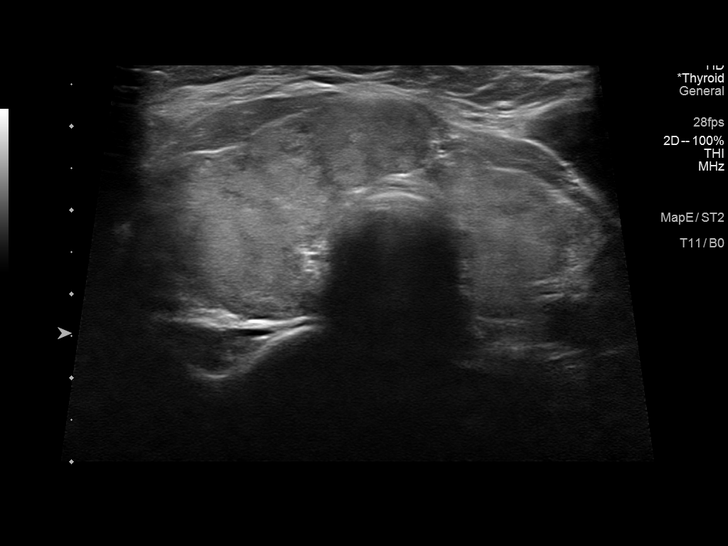
[im 38/42]
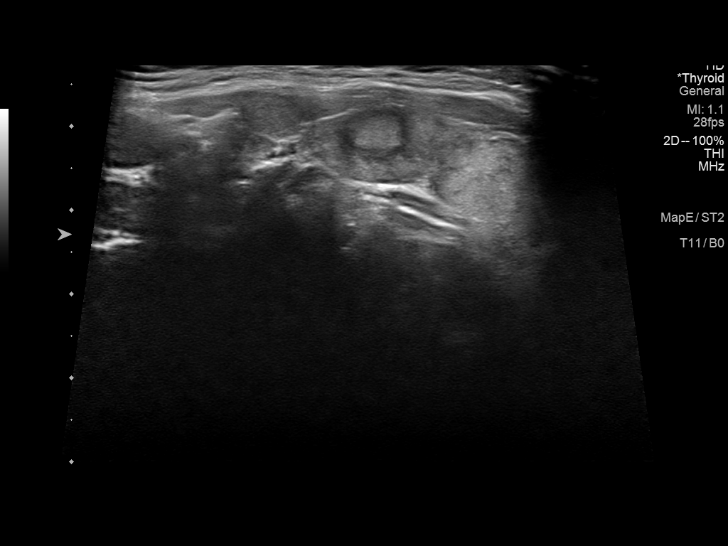
[im 42/42]
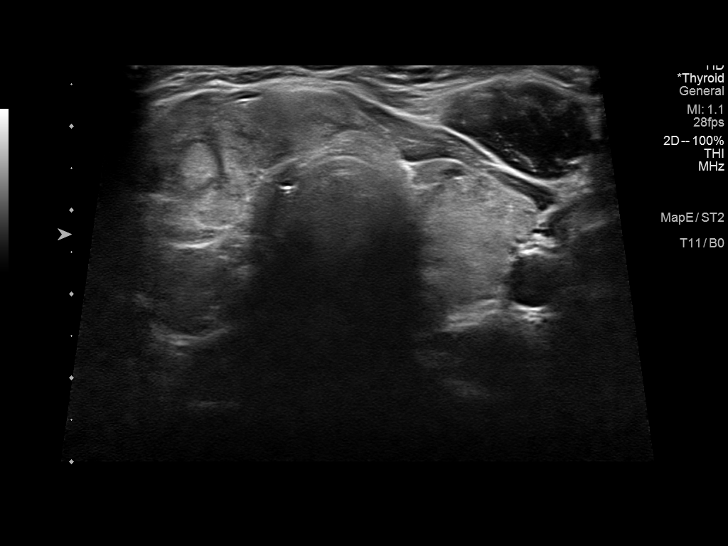

[14 of 25 positions shown; findings below may reference images not displayed]

FINDINGS: Parenchymal Echotexture: Moderately heterogenous

Isthmus: 0.8 cm

Right lobe: 5.1 x 2.1 x 2 cm

Left lobe: 5.3 x 1.3 x 1.5 cm

_________________________________________________________

Estimated total number of nodules >/= 1 cm: 0

Number of spongiform nodules >/=  2 cm not described below (TR1): 0

Number of mixed cystic and solid nodules >/= 1.5 cm not described
below (TR2): 0

_________________________________________________________

There is a small 0.8 cm thyroid nodule in the right mid thyroid
gland. This is favored to represent the previously demonstrated
isthmus nodule. However, it appears isoechoic to hyperechoic on
today's exam.
IMPRESSION: Stable subcentimeter isthmus nodule. No new thyroid nodules
identified on this study. No further follow-up is recommended.

The above is in keeping with the ACR TI-RADS recommendations - [HOSPITAL] 4944;[DATE].

## 2022-02-03 DIAGNOSIS — F411 Generalized anxiety disorder: Secondary | ICD-10-CM | POA: Diagnosis not present

## 2022-02-03 DIAGNOSIS — F330 Major depressive disorder, recurrent, mild: Secondary | ICD-10-CM | POA: Diagnosis not present

## 2022-02-03 DIAGNOSIS — Z79899 Other long term (current) drug therapy: Secondary | ICD-10-CM | POA: Diagnosis not present

## 2022-02-03 DIAGNOSIS — F1320 Sedative, hypnotic or anxiolytic dependence, uncomplicated: Secondary | ICD-10-CM | POA: Diagnosis not present

## 2022-02-03 DIAGNOSIS — Z5181 Encounter for therapeutic drug level monitoring: Secondary | ICD-10-CM | POA: Diagnosis not present

## 2022-03-18 DIAGNOSIS — F900 Attention-deficit hyperactivity disorder, predominantly inattentive type: Secondary | ICD-10-CM | POA: Diagnosis not present

## 2022-03-29 DIAGNOSIS — F330 Major depressive disorder, recurrent, mild: Secondary | ICD-10-CM | POA: Diagnosis not present

## 2022-03-29 DIAGNOSIS — F900 Attention-deficit hyperactivity disorder, predominantly inattentive type: Secondary | ICD-10-CM | POA: Diagnosis not present

## 2022-04-22 DIAGNOSIS — F330 Major depressive disorder, recurrent, mild: Secondary | ICD-10-CM | POA: Diagnosis not present

## 2022-04-22 DIAGNOSIS — F900 Attention-deficit hyperactivity disorder, predominantly inattentive type: Secondary | ICD-10-CM | POA: Diagnosis not present

## 2022-06-02 DIAGNOSIS — F330 Major depressive disorder, recurrent, mild: Secondary | ICD-10-CM | POA: Diagnosis not present

## 2022-06-02 DIAGNOSIS — F900 Attention-deficit hyperactivity disorder, predominantly inattentive type: Secondary | ICD-10-CM | POA: Diagnosis not present

## 2022-06-22 DIAGNOSIS — L309 Dermatitis, unspecified: Secondary | ICD-10-CM | POA: Diagnosis not present

## 2022-08-25 DIAGNOSIS — F330 Major depressive disorder, recurrent, mild: Secondary | ICD-10-CM | POA: Diagnosis not present

## 2022-08-25 DIAGNOSIS — F900 Attention-deficit hyperactivity disorder, predominantly inattentive type: Secondary | ICD-10-CM | POA: Diagnosis not present

## 2022-10-01 DIAGNOSIS — F330 Major depressive disorder, recurrent, mild: Secondary | ICD-10-CM | POA: Diagnosis not present

## 2022-10-01 DIAGNOSIS — F900 Attention-deficit hyperactivity disorder, predominantly inattentive type: Secondary | ICD-10-CM | POA: Diagnosis not present

## 2022-11-01 DIAGNOSIS — F900 Attention-deficit hyperactivity disorder, predominantly inattentive type: Secondary | ICD-10-CM | POA: Diagnosis not present

## 2022-11-01 DIAGNOSIS — F330 Major depressive disorder, recurrent, mild: Secondary | ICD-10-CM | POA: Diagnosis not present

## 2022-11-08 IMAGING — DX DG CHEST 1V
1 series · 1 of 1 positions shown · non-contrast
Comparison: CT 05/02/2008

CLINICAL DATA: Midsternal chest pain for 2 years, along glass night
with sharp central chest pain

EXAM:
CHEST  1 VIEW

[chest pa]
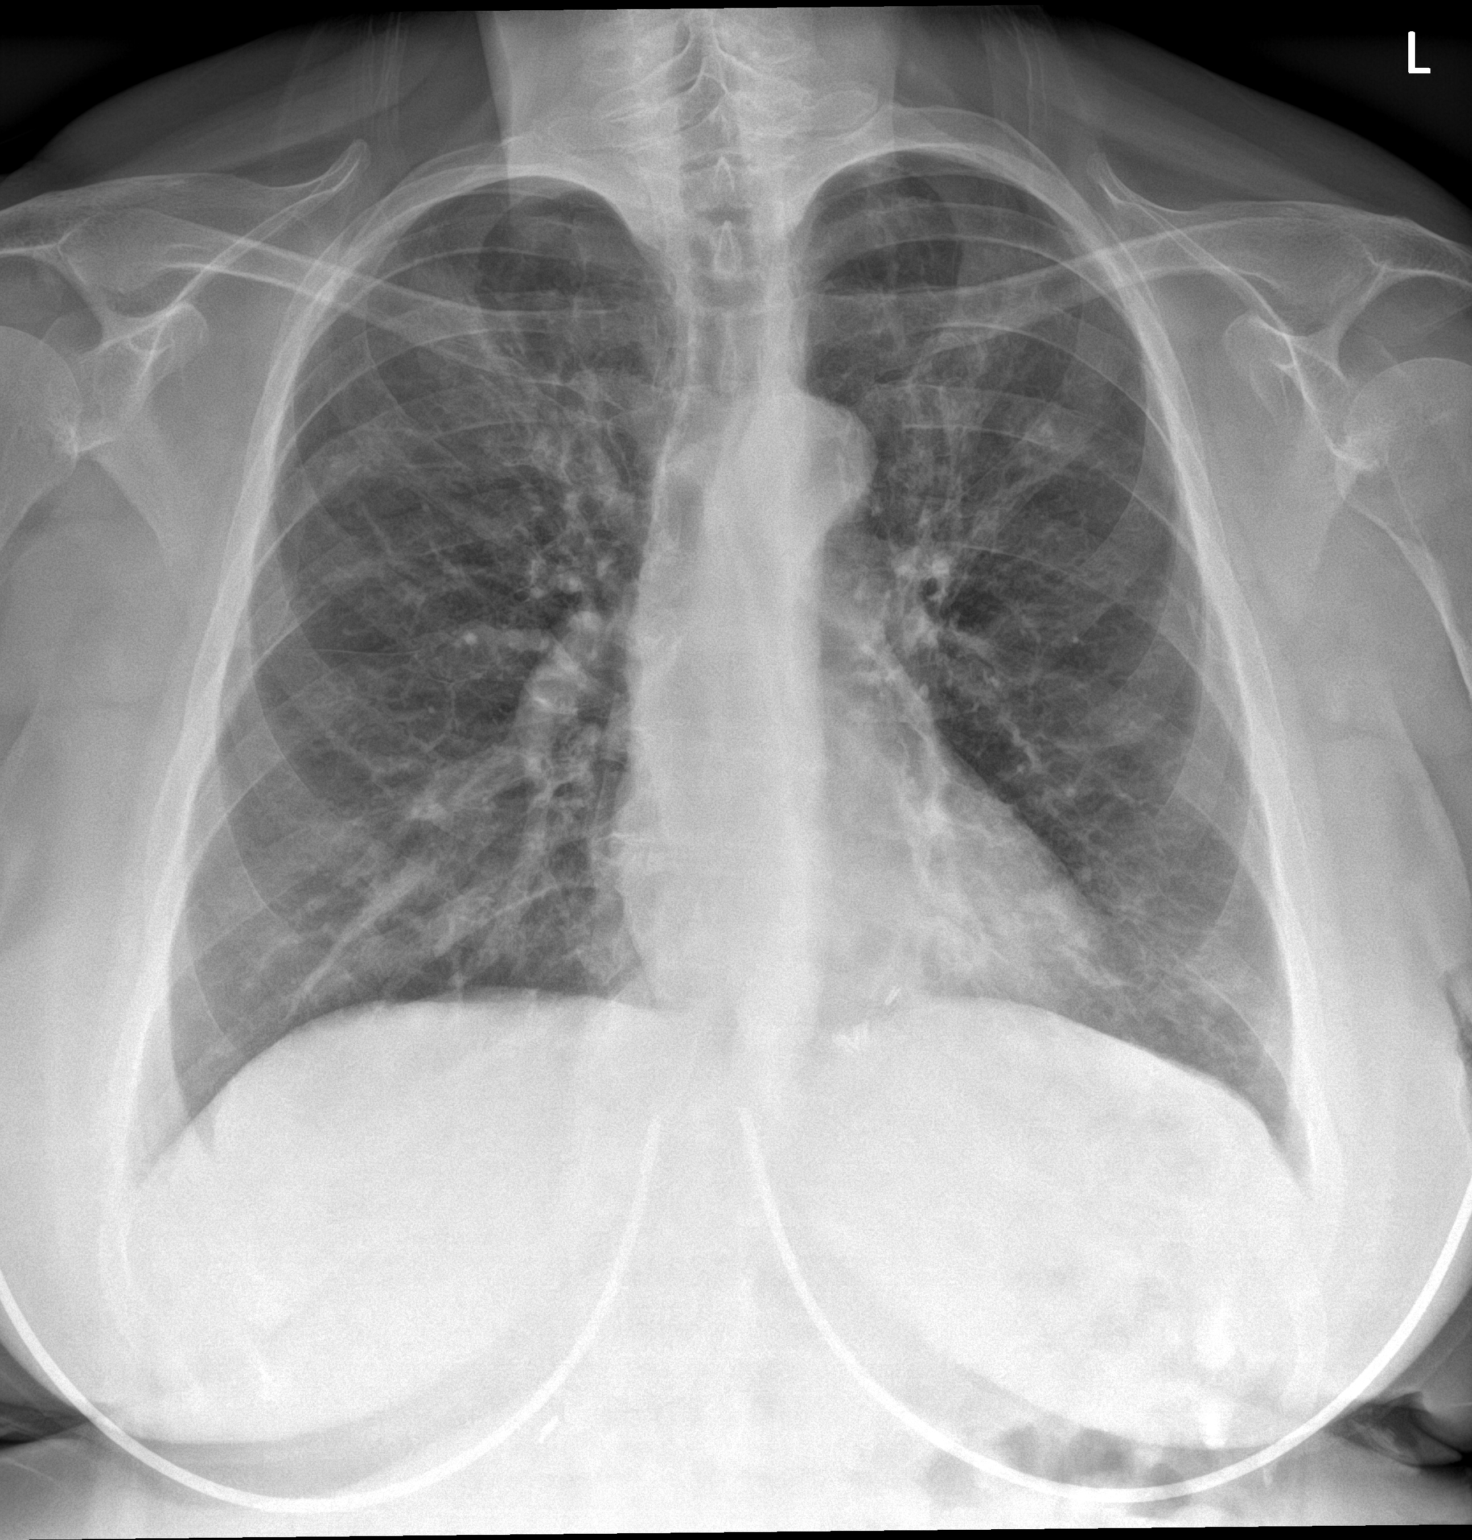

[1 of 1 positions shown; findings below may reference images not displayed]

FINDINGS: No consolidation, features of edema, pneumothorax, or effusion.
Pulmonary vascularity is normally distributed. The cardiomediastinal
contours are unremarkable. No acute osseous or soft tissue
abnormality. Surgical clips noted at the diaphragmatic hiatus and in
the gallbladder fossa.
IMPRESSION: No acute cardiopulmonary abnormality.

## 2022-11-16 DIAGNOSIS — E039 Hypothyroidism, unspecified: Secondary | ICD-10-CM | POA: Diagnosis not present

## 2022-11-23 DIAGNOSIS — Z1339 Encounter for screening examination for other mental health and behavioral disorders: Secondary | ICD-10-CM | POA: Diagnosis not present

## 2022-11-23 DIAGNOSIS — F418 Other specified anxiety disorders: Secondary | ICD-10-CM | POA: Diagnosis not present

## 2022-11-23 DIAGNOSIS — Z1331 Encounter for screening for depression: Secondary | ICD-10-CM | POA: Diagnosis not present

## 2022-11-23 DIAGNOSIS — Z0000 Encounter for general adult medical examination without abnormal findings: Secondary | ICD-10-CM | POA: Diagnosis not present

## 2023-01-06 DIAGNOSIS — F900 Attention-deficit hyperactivity disorder, predominantly inattentive type: Secondary | ICD-10-CM | POA: Diagnosis not present

## 2023-01-06 DIAGNOSIS — F330 Major depressive disorder, recurrent, mild: Secondary | ICD-10-CM | POA: Diagnosis not present

## 2023-04-28 DIAGNOSIS — F330 Major depressive disorder, recurrent, mild: Secondary | ICD-10-CM | POA: Diagnosis not present

## 2023-04-28 DIAGNOSIS — F900 Attention-deficit hyperactivity disorder, predominantly inattentive type: Secondary | ICD-10-CM | POA: Diagnosis not present

## 2023-07-12 DIAGNOSIS — F900 Attention-deficit hyperactivity disorder, predominantly inattentive type: Secondary | ICD-10-CM | POA: Diagnosis not present

## 2023-07-12 DIAGNOSIS — F330 Major depressive disorder, recurrent, mild: Secondary | ICD-10-CM | POA: Diagnosis not present

## 2023-10-12 DIAGNOSIS — F330 Major depressive disorder, recurrent, mild: Secondary | ICD-10-CM | POA: Diagnosis not present

## 2023-10-12 DIAGNOSIS — F900 Attention-deficit hyperactivity disorder, predominantly inattentive type: Secondary | ICD-10-CM | POA: Diagnosis not present

## 2023-11-15 DIAGNOSIS — R509 Fever, unspecified: Secondary | ICD-10-CM | POA: Diagnosis not present

## 2023-11-15 DIAGNOSIS — J208 Acute bronchitis due to other specified organisms: Secondary | ICD-10-CM | POA: Diagnosis not present

## 2023-11-15 DIAGNOSIS — R051 Acute cough: Secondary | ICD-10-CM | POA: Diagnosis not present

## 2023-11-23 DIAGNOSIS — D649 Anemia, unspecified: Secondary | ICD-10-CM | POA: Diagnosis not present

## 2023-11-23 DIAGNOSIS — E039 Hypothyroidism, unspecified: Secondary | ICD-10-CM | POA: Diagnosis not present

## 2023-11-23 DIAGNOSIS — E611 Iron deficiency: Secondary | ICD-10-CM | POA: Diagnosis not present

## 2023-11-30 DIAGNOSIS — Z1339 Encounter for screening examination for other mental health and behavioral disorders: Secondary | ICD-10-CM | POA: Diagnosis not present

## 2023-11-30 DIAGNOSIS — E039 Hypothyroidism, unspecified: Secondary | ICD-10-CM | POA: Diagnosis not present

## 2023-11-30 DIAGNOSIS — Z0000 Encounter for general adult medical examination without abnormal findings: Secondary | ICD-10-CM | POA: Diagnosis not present

## 2023-11-30 DIAGNOSIS — Z1331 Encounter for screening for depression: Secondary | ICD-10-CM | POA: Diagnosis not present

## 2023-12-06 DIAGNOSIS — J189 Pneumonia, unspecified organism: Secondary | ICD-10-CM | POA: Diagnosis not present

## 2023-12-06 DIAGNOSIS — R058 Other specified cough: Secondary | ICD-10-CM | POA: Diagnosis not present

## 2023-12-06 DIAGNOSIS — R0981 Nasal congestion: Secondary | ICD-10-CM | POA: Diagnosis not present

## 2023-12-22 DIAGNOSIS — R058 Other specified cough: Secondary | ICD-10-CM | POA: Diagnosis not present

## 2023-12-22 DIAGNOSIS — J101 Influenza due to other identified influenza virus with other respiratory manifestations: Secondary | ICD-10-CM | POA: Diagnosis not present

## 2024-01-09 DIAGNOSIS — F900 Attention-deficit hyperactivity disorder, predominantly inattentive type: Secondary | ICD-10-CM | POA: Diagnosis not present

## 2024-01-09 DIAGNOSIS — F330 Major depressive disorder, recurrent, mild: Secondary | ICD-10-CM | POA: Diagnosis not present

## 2024-04-09 DIAGNOSIS — F330 Major depressive disorder, recurrent, mild: Secondary | ICD-10-CM | POA: Diagnosis not present

## 2024-04-09 DIAGNOSIS — F900 Attention-deficit hyperactivity disorder, predominantly inattentive type: Secondary | ICD-10-CM | POA: Diagnosis not present

## 2024-05-04 DIAGNOSIS — D225 Melanocytic nevi of trunk: Secondary | ICD-10-CM | POA: Diagnosis not present

## 2024-05-04 DIAGNOSIS — D2271 Melanocytic nevi of right lower limb, including hip: Secondary | ICD-10-CM | POA: Diagnosis not present

## 2024-05-04 DIAGNOSIS — D1801 Hemangioma of skin and subcutaneous tissue: Secondary | ICD-10-CM | POA: Diagnosis not present

## 2024-05-04 DIAGNOSIS — L578 Other skin changes due to chronic exposure to nonionizing radiation: Secondary | ICD-10-CM | POA: Diagnosis not present

## 2024-05-04 DIAGNOSIS — D485 Neoplasm of uncertain behavior of skin: Secondary | ICD-10-CM | POA: Diagnosis not present

## 2024-05-04 DIAGNOSIS — I781 Nevus, non-neoplastic: Secondary | ICD-10-CM | POA: Diagnosis not present

## 2024-08-01 DIAGNOSIS — F330 Major depressive disorder, recurrent, mild: Secondary | ICD-10-CM | POA: Diagnosis not present

## 2024-08-01 DIAGNOSIS — F900 Attention-deficit hyperactivity disorder, predominantly inattentive type: Secondary | ICD-10-CM | POA: Diagnosis not present

## 2024-11-26 DIAGNOSIS — D649 Anemia, unspecified: Secondary | ICD-10-CM | POA: Diagnosis not present

## 2024-12-03 DIAGNOSIS — Z0000 Encounter for general adult medical examination without abnormal findings: Secondary | ICD-10-CM | POA: Diagnosis not present

## 2024-12-03 DIAGNOSIS — E039 Hypothyroidism, unspecified: Secondary | ICD-10-CM | POA: Diagnosis not present

## 2024-12-03 DIAGNOSIS — Z1339 Encounter for screening examination for other mental health and behavioral disorders: Secondary | ICD-10-CM | POA: Diagnosis not present
